# Patient Record
Sex: Female | Born: 2000 | Race: White | Hispanic: No | Marital: Single | Smoking: Never smoker
Health system: Southern US, Community
[De-identification: ages and names within clinical notes are randomized; demographics above are authoritative.]

## PROBLEM LIST (undated history)

## (undated) DIAGNOSIS — G43909 Migraine, unspecified, not intractable, without status migrainosus: Secondary | ICD-10-CM

## (undated) DIAGNOSIS — I456 Pre-excitation syndrome: Secondary | ICD-10-CM

## (undated) DIAGNOSIS — J45909 Unspecified asthma, uncomplicated: Secondary | ICD-10-CM

## (undated) DIAGNOSIS — F419 Anxiety disorder, unspecified: Secondary | ICD-10-CM

## (undated) HISTORY — DX: Pre-excitation syndrome: I45.6

## (undated) HISTORY — DX: Anxiety disorder, unspecified: F41.9

## (undated) HISTORY — DX: Migraine, unspecified, not intractable, without status migrainosus: G43.909

## (undated) HISTORY — DX: Unspecified asthma, uncomplicated: J45.909

---
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
## 2018-02-23 ENCOUNTER — Other Ambulatory Visit (INDEPENDENT_AMBULATORY_CARE_PROVIDER_SITE_OTHER): Payer: Self-pay | Admitting: Pediatrics

## 2018-02-23 DIAGNOSIS — G43009 Migraine without aura, not intractable, without status migrainosus: Secondary | ICD-10-CM

## 2018-05-06 DIAGNOSIS — J029 Acute pharyngitis, unspecified: Secondary | ICD-10-CM | POA: Diagnosis not present

## 2018-05-06 DIAGNOSIS — J309 Allergic rhinitis, unspecified: Secondary | ICD-10-CM | POA: Diagnosis not present

## 2018-09-14 DIAGNOSIS — B373 Candidiasis of vulva and vagina: Secondary | ICD-10-CM | POA: Diagnosis not present

## 2018-09-14 DIAGNOSIS — N898 Other specified noninflammatory disorders of vagina: Secondary | ICD-10-CM | POA: Diagnosis not present

## 2018-09-19 DIAGNOSIS — N760 Acute vaginitis: Secondary | ICD-10-CM | POA: Diagnosis not present

## 2018-09-19 DIAGNOSIS — Z30011 Encounter for initial prescription of contraceptive pills: Secondary | ICD-10-CM | POA: Diagnosis not present

## 2018-09-19 DIAGNOSIS — N765 Ulceration of vagina: Secondary | ICD-10-CM | POA: Diagnosis not present

## 2018-09-26 DIAGNOSIS — A6000 Herpesviral infection of urogenital system, unspecified: Secondary | ICD-10-CM | POA: Diagnosis not present

## 2018-09-26 DIAGNOSIS — R450 Nervousness: Secondary | ICD-10-CM | POA: Diagnosis not present

## 2018-11-21 ENCOUNTER — Other Ambulatory Visit (INDEPENDENT_AMBULATORY_CARE_PROVIDER_SITE_OTHER): Payer: Self-pay | Admitting: Pediatrics

## 2018-11-21 DIAGNOSIS — G43009 Migraine without aura, not intractable, without status migrainosus: Secondary | ICD-10-CM

## 2018-12-18 ENCOUNTER — Telehealth (INDEPENDENT_AMBULATORY_CARE_PROVIDER_SITE_OTHER): Payer: Self-pay | Admitting: Radiology

## 2018-12-18 DIAGNOSIS — G43009 Migraine without aura, not intractable, without status migrainosus: Secondary | ICD-10-CM

## 2018-12-18 MED ORDER — TOPIRAMATE 25 MG PO TABS
ORAL_TABLET | ORAL | 1 refills | Status: DC
Start: 2018-12-18 — End: 2019-01-07

## 2018-12-18 NOTE — Telephone Encounter (Signed)
Refill has been sent to the pharmacy.  

## 2018-12-18 NOTE — Telephone Encounter (Signed)
  Who's calling (name and relationship to patient) : Kellie Fowler  - father   Best contact number: [phone]  Provider they see: Dr Gaynell Face   Reason for call:  Dad called advising that [name] is almost out of Topiramate and will need a refill soon. Her last dose is by Sunday    PRESCRIPTION REFILL ONLY  Name of prescription: Topiramate 25 MG Tablet  Pharmacy:  [facility]  [address]  [place], [place] [address]

## 2019-01-07 ENCOUNTER — Other Ambulatory Visit: Payer: Self-pay

## 2019-01-07 ENCOUNTER — Ambulatory Visit (INDEPENDENT_AMBULATORY_CARE_PROVIDER_SITE_OTHER): Payer: BC Managed Care – PPO | Admitting: Pediatrics

## 2019-01-07 ENCOUNTER — Encounter (INDEPENDENT_AMBULATORY_CARE_PROVIDER_SITE_OTHER): Payer: Self-pay | Admitting: Pediatrics

## 2019-01-07 VITALS — BP 102/80 | HR 68 | Ht 61.75 in | Wt 168.4 lb

## 2019-01-07 DIAGNOSIS — G44219 Episodic tension-type headache, not intractable: Secondary | ICD-10-CM

## 2019-01-07 DIAGNOSIS — G43009 Migraine without aura, not intractable, without status migrainosus: Secondary | ICD-10-CM

## 2019-01-07 MED ORDER — TOPIRAMATE 25 MG PO TABS: ORAL_TABLET | ORAL | 5 refills | Status: DC

## 2019-01-07 NOTE — Patient Instructions (Signed)
It was good to see you again.  I am glad that you are not experiencing migraines.  I would like to see you in 6 months time.  I do not want to change your topiramate until you are out of school.  Please let me know if there is any change over the next few months.  I hope that you will take the opportunity this school break to become more physically active in the she will continue that when you return to bone.

## 2019-01-07 NOTE — Progress Notes (Signed)
Patient: Kellie Fowler MRN: [id number] Sex: female DOB: 2000/05/27  Provider: Wyline Copas, MD Location of Care: [facility]  Note type: Routine return visit  History of Present Illness: Referral Source: Judithann Sauger, MD History from: patient and [facility] chart Chief Complaint: Headaches  AEMILIA Fowler is a 18 y.o. female who was evaluated January 07, 2019 for the first time since March 26, 2017.  Kerah has migraine without aura and tension type headaches.  Migraines have been completely controlled with topiramate.  She has occasional tension type headaches once or twice a week that respond either to rest, hydration, or over-the-counter medication.  She has just completed her first semester of her sophomore year at [employer].  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  She has a couple of virtual tests to take at home and will not return to school until mid-January.  She is going to work at [employer] with her brother.  She lives in a dorm on campus and has 1 roommate in a suite that was made for 4 students two of the students left school and did not return.  In general her health is good but unfortunately she has gained 18 pounds in 20 months.  I challenged her to increase her physical activity during this holiday time when she has more time.  She has always enjoyed being physically active as she gets back into that it may carryover when she returns to school.  I recommended that she walk hike to around a soccer ball or go jogging.  I told her not to overdo it.  She takes fluoxetine for anxiety and depression this is prescribed by another physician.  She also takes Loestrin which has controlled dysmenorrhea.  Review of Systems: A complete review of systems was remarkable for patient is here to be seen for headaches. She states that she has one to two headaches a week. She states that the headaches contribute to being on virtual learning. She reports no  symptoms with the headaches. No other concerns at this time., all other systems reviewed and negative.  Past Medical History Diagnosis Date  . Anxiety   . Asthma   . Migraine   . Wolf-Parkinson-White syndrome    Hospitalizations: No., Head Injury: No., Nervous System Infections: No., Immunizations up to date: Yes.    Birth History 7lbs. 0oz. infant born at [redacted]weeks gestational age to a 20year old g 1p 52female. Gestation wasuncomplicated normal spontaneous vaginal delivery Nursery Course wasuncomplicated Growth and Development wasrecalled asnormal  Behavior History Anxiety  Surgical History History reviewed. No pertinent surgical history.  Family History family history includes Anxiety disorder in her father, mother, and paternal grandmother; Bipolar disorder in her paternal grandfather; Cancer in her paternal grandmother; Delayed puberty in her father; Depression in her father, mother, and paternal grandmother; Migraines in her mother; Seizures in her mother. Family history is negative for intellectual disabilities, blindness, deafness, birth defects, chromosomal disorder, or autism.  Social History Socioeconomic History  . Marital status: Single  . Years of education:  49  . Highest education level:  Sophomore in college  Occupational History  .  Working part-time at [employer]  . Financial resource strain: Not on file  . Food insecurity    Worry: Not on file    Inability: Not on file  . Transportation needs    Medical: Not on file    Non-medical: Not on file  Tobacco Use  . Smoking status: Never Smoker  .  Smokeless tobacco: Never Used  Substance and Sexual Activity  . Alcohol use: Not on file  . Drug use: Not on file  . 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    She attended [employer].    She lives with both parents. She has a younger brother.    She enjoys Soccer,  swimming, and playing with her dogs.    She is a Medical laboratory scientific officer at [employer]   No Known Allergies  Physical Exam BP 102/80   Pulse 68   Ht 5' 1.75" (1.568 m)   Wt 168 lb 6.4 oz (76.4 kg)   BMI 31.05 kg/m   General: alert, well developed, obese, in no acute distress, sandy hair, blue eyes, right handed Head: normocephalic, no dysmorphic features Ears, Nose and Throat: Otoscopic: tympanic membranes normal; pharynx: oropharynx is pink without exudates or tonsillar hypertrophy Neck: supple, full range of motion, no cranial or cervical bruits Respiratory: auscultation clear Cardiovascular: no murmurs, pulses are normal Musculoskeletal: no skeletal deformities or apparent scoliosis Skin: no rashes or neurocutaneous lesions  Neurologic Exam  Mental Status: alert; oriented to person, place and year; knowledge is normal for age; language is normal Cranial Nerves: visual fields are full to double simultaneous stimuli; extraocular movements are full and conjugate; pupils are round reactive to light; funduscopic examination shows sharp disc margins with normal vessels; symmetric facial strength; midline tongue and uvula; air conduction is greater than bone conduction bilaterally Motor: Normal strength, tone and mass; good fine motor movements; no pronator drift Sensory: intact responses to cold, vibration, proprioception and stereognosis Coordination: good finger-to-nose, rapid repetitive alternating movements and finger apposition Gait and Station: normal gait and station: patient is able to walk on heels, toes and tandem without difficulty; balance is adequate; Romberg exam is negative; Gower response is negative Reflexes: symmetric and diminished bilaterally; no clonus; bilateral flexor plantar responses  Assessment 1.  Migraine without aura without status migrainosus, not intractable, G43.009. 2.  Episodic tension type headache, G 44.219.  Discussion I am pleased that [name] is not  experiencing migraines.  I encouraged her to continue to take topiramate for at least the rest of the semester.  I would like to see her in 6 months when school is out and determine whether or not we continue her on topiramate.  Plan I talked about increasing her physical activity and working on portion control.  She has been under enormous stress with her mother's chronic illness and so I mentioned this but did not dwell on it.  I refilled her prescription for topiramate.  Greater than 50% of a 20-minute visit was spent in counseling and coordination of care concerning her headaches, her school performance, her weight, and setting a goal for her to maintain her weight over the next 6 months.   Medication List   Accurate as of January 07, 2019 11:59 PM. If you have any questions, ask your nurse or doctor.    FLUoxetine HCl 60 MG Tabs Take 1 tablet by mouth daily. What changed: Another medication with the same name was removed. Continue taking this medication, and follow the directions you see here. Changed by: Ellison Carwin, MD   Lo Loestrin Fe 1 MG-10 MCG / 10 MCG tablet Generic drug: Norethindrone-Ethinyl Estradiol-Fe Biphas Take 1 tablet See admin instructions by mouth.   topiramate 25 MG tablet Commonly known as: TOPAMAX TAKE  2 TABLETS AT NIGHTTIME    The  medication list was reviewed and reconciled. All changes or newly prescribed medications were explained.  A complete medication list was provided to the patient/caregiver.  Jodi Geralds MD

## 2019-01-26 DIAGNOSIS — Z1331 Encounter for screening for depression: Secondary | ICD-10-CM | POA: Diagnosis not present

## 2019-01-26 DIAGNOSIS — Z6830 Body mass index (BMI) 30.0-30.9, adult: Secondary | ICD-10-CM | POA: Diagnosis not present

## 2019-01-26 DIAGNOSIS — F419 Anxiety disorder, unspecified: Secondary | ICD-10-CM | POA: Diagnosis not present

## 2019-01-26 DIAGNOSIS — G43909 Migraine, unspecified, not intractable, without status migrainosus: Secondary | ICD-10-CM | POA: Diagnosis not present

## 2019-01-26 DIAGNOSIS — Z0001 Encounter for general adult medical examination with abnormal findings: Secondary | ICD-10-CM | POA: Diagnosis not present

## 2019-01-26 DIAGNOSIS — Z23 Encounter for immunization: Secondary | ICD-10-CM | POA: Diagnosis not present

## 2019-01-26 DIAGNOSIS — Z713 Dietary counseling and surveillance: Secondary | ICD-10-CM | POA: Diagnosis not present

## 2019-01-26 DIAGNOSIS — Z0000 Encounter for general adult medical examination without abnormal findings: Secondary | ICD-10-CM | POA: Diagnosis not present

## 2019-01-26 DIAGNOSIS — Z7251 High risk heterosexual behavior: Secondary | ICD-10-CM | POA: Diagnosis not present

## 2019-03-01 DIAGNOSIS — Z20828 Contact with and (suspected) exposure to other viral communicable diseases: Secondary | ICD-10-CM | POA: Diagnosis not present

## 2019-03-20 ENCOUNTER — Other Ambulatory Visit (INDEPENDENT_AMBULATORY_CARE_PROVIDER_SITE_OTHER): Payer: Self-pay | Admitting: Pediatrics

## 2019-03-20 DIAGNOSIS — G43009 Migraine without aura, not intractable, without status migrainosus: Secondary | ICD-10-CM

## 2019-06-26 DIAGNOSIS — Z20828 Contact with and (suspected) exposure to other viral communicable diseases: Secondary | ICD-10-CM | POA: Diagnosis not present

## 2019-07-08 ENCOUNTER — Encounter (INDEPENDENT_AMBULATORY_CARE_PROVIDER_SITE_OTHER): Payer: Self-pay | Admitting: Pediatrics

## 2019-07-08 ENCOUNTER — Ambulatory Visit (INDEPENDENT_AMBULATORY_CARE_PROVIDER_SITE_OTHER): Payer: BC Managed Care – PPO | Admitting: Pediatrics

## 2019-07-08 ENCOUNTER — Other Ambulatory Visit: Payer: Self-pay

## 2019-07-08 VITALS — BP 100/66 | HR 80 | Ht 61.75 in | Wt 163.4 lb

## 2019-07-08 DIAGNOSIS — G44219 Episodic tension-type headache, not intractable: Secondary | ICD-10-CM

## 2019-07-08 DIAGNOSIS — G43009 Migraine without aura, not intractable, without status migrainosus: Secondary | ICD-10-CM

## 2019-07-08 MED ORDER — TOPIRAMATE 25 MG PO TABS: ORAL_TABLET | ORAL | 5 refills | Status: DC

## 2019-07-08 NOTE — Progress Notes (Deleted)
Patient: Kellie Fowler MRN: [id number] Sex: female DOB: 24-May-2000  Provider: Ellison Carwin, MD Location of Care: [facility]  Note type: Routine return visit  History of Present Illness: Referral Source: Ronney Asters, MD History from: patient and [facility] chart Chief Complaint: Headaches  Kellie Fowler is a 19 y.o. female who ***  Review of Systems: A complete review of systems was remarkable for patient is here to be seen for headaches. She reports that she has one to two headaches every other week. She states that she has one migraine a month. She reports no symptoms. She has no other concerns at this time., all other systems reviewed and negative.  Past Medical History Past Medical History:  Diagnosis Date  . Anxiety   . Asthma   . Migraine   . Wolf-Parkinson-White syndrome    Hospitalizations: No., Head Injury: No., Nervous System Infections: No., Immunizations up to date: Yes.    ***  Birth History *** lbs. *** oz. infant born at *** weeks gestational age to a *** year old g *** p *** *** *** *** female. Gestation was {Complicated/Uncomplicated Pregnancy:20185} Mother received {CN Delivery analgesics:210120005}  {method of delivery:313099} Nursery Course was {Complicated/Uncomplicated:20316} Growth and Development was {cn recall:210120004}  Behavior History {Symptoms; behavioral problems:18883}  Surgical History History reviewed. No pertinent surgical history.  Family History family history includes Anxiety disorder in her father, mother, and paternal grandmother; Bipolar disorder in her paternal grandfather; Cancer in her paternal grandmother; Delayed puberty in her father; Depression in her father, mother, and paternal grandmother; Migraines in her mother; Seizures in her mother. Family history is negative for migraines, seizures, intellectual disabilities, blindness, deafness, birth defects, chromosomal disorder, or autism.  Social  History Social History   Socioeconomic History  . Marital status: Single    Spouse name: Not on file  . Number of children: Not on file  . Years of education: Not on file  . Highest education level: Not on file  Occupational History  . Not on file  Tobacco Use  . Smoking status: Never Smoker  . Smokeless tobacco: Never Used  Substance and Sexual Activity  . Alcohol use: Not on file  . Drug use: Not on file  . Sexual activity: Not on file  Other Topics Concern  . 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   She attended [employer].   She lives with both parents. She has a younger brother.   She enjoys Soccer, swimming, and playing with her dogs.   She is a Medical laboratory scientific officer at [employer] Strain:   . Difficulty of Paying Living Expenses:   Food Insecurity:   . Worried About Programme researcher, broadcasting/film/video in the Last Year:   . Barista in the Last Year:   Transportation Needs:   . Freight forwarder (Medical):   Marland Kitchen Lack of Transportation (Non-Medical):   Physical Activity:   . Days of Exercise per Week:   . Minutes of Exercise per Session:   Stress:   . Feeling of Stress :   Social Connections:   . Frequency of Communication with Friends and Family:   . Frequency of Social Gatherings with Friends and Family:   . Attends Religious Services:   . Active Member of Clubs or Organizations:   . Attends Banker Meetings:   Marland Kitchen Marital Status:  Allergies No Known Allergies  Physical Exam BP 100/66   Pulse 80   Ht 5' 1.75" (1.568 m)   Wt 163 lb 6.4 oz (74.1 kg)   BMI 30.13 kg/m   ***   Assessment   Discussion   Plan  Allergies as of 07/08/2019   No Known Allergies     Medication List       Accurate as of July 08, 2019  3:05 PM. If you have any questions, ask your nurse or doctor.        FLUoxetine HCl 60 MG Tabs Take 1 tablet by mouth  daily.   Lo Loestrin Fe 1 MG-10 MCG / 10 MCG tablet Generic drug: Norethindrone-Ethinyl Estradiol-Fe Biphas Take 1 tablet See admin instructions by mouth.   topiramate 25 MG tablet Commonly known as: TOPAMAX TAKE 2 TABLETS ORALLY AT NIGHTTIME       The medication list was reviewed and reconciled. All changes or newly prescribed medications were explained.  A complete medication list was provided to the patient/caregiver.  Jodi Geralds MD

## 2019-07-08 NOTE — Patient Instructions (Addendum)
It was great to see you today.  I am glad that you are doing well with your headaches and anxiety.  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  This summer sounds like fun.  Please let me know if there is anything that I can do for you using My Chart.

## 2019-07-08 NOTE — Progress Notes (Signed)
Patient: Kellie Fowler MRN: [id number] Sex: female DOB: 16-Sep-2000  Provider: Wyline Copas, MD Location of Care: [facility]  Note type: Routine return visit  History of Present Illness: Referral Source: Judithann Sauger, MD History from: patient and [facility] chart  Chief Complaint: Migraines   Kellie Fowler is a 19 y.o. female who has a history of migraine without aura and tension type headaches, last evaluated on January 07, 2019.   Kellie Fowler states that everything has been going very well. She states that she is only having 1-2 migraines every month. She is currently taking Tompamax 50mg  at night and denies any common side effects. Kellie Fowler will take 200-400mg  ibuprofen for abortive therapy and this works very well. She feels that the headaches are from staring at the computer during online schooling and do not correlate with menstruation.   Kellie Fowler also has a history of anxiety, for which she is currently taking fluoxetine and feels that her anxiety is very well managed. She just finished her sophomore year of college, all of which was online. She will return back to in-person schooling on August 10th, 2021 for her junior year in [employer]. Her brother will be starting at [employer] in the fall. Kellie Fowler states that she is sleeping well, maintaining adequate hydration, and has no questions or concerns today.   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  She has been immunized for coronavirus.  Review of Systems: A complete review of systems was assessed and was negative.  Past Medical History Diagnosis Date  . Anxiety   . Asthma   . Migraine   . Wolf-Parkinson-White syndrome    Hospitalizations: No., Head Injury: No., Nervous System Infections: No., Immunizations up to date: Yes.    Birth History 7 lbs. 0 oz. infant born at [redacted] weeks gestational age to a 21 year old g 1 p 0 female. Gestation was uncomplicated normal spontaneous  vaginal delivery Nursery Course was uncomplicated Growth and Development was recalled as  normal  Behavior History Anxiety  Surgical History History reviewed. No pertinent surgical history.  Family History family history includes Anxiety disorder in her father, mother, and paternal grandmother; Bipolar disorder in her paternal grandfather; Cancer in her paternal grandmother; Delayed puberty in her father; Depression in her father, mother, and paternal grandmother; Migraines in her mother; Seizures in her mother. Family history is negative for intellectual disabilities, blindness, deafness, birth defects, chromosomal disorder, or autism.  Social History Socioeconomic History  . Marital status: Single  . Years of education:  22  . Highest education level:  Rising junior in college  Occupational History  .  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 Smoking status: Never Smoker  . Smokeless tobacco: Never Used  Substance and Sexual Activity  . Alcohol use: Not on file  . Drug use: Not on file  . 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    She attended [employer].    She lives with both parents. She has a younger brother.    She enjoys Soccer, swimming, and playing with her dogs.    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   No Known Allergies  Physical Exam BP 100/66   Pulse 80   Ht 5' 1.75" (1.568 m)   Wt 163 lb 6.4 oz (74.1 kg)  BMI 30.13 kg/m   General: alert, well developed, obese, in no acute distress, brown hair, blue eyes, right handed Head: normocephalic, no dysmorphic features Ears, Nose and Throat: Otoscopic: tympanic membranes normal; pharynx: oropharynx is pink without exudates or tonsillar hypertrophy Neck: supple, full range of motion, no cranial or cervical bruits Respiratory: auscultation clear Cardiovascular: no murmurs,  pulses are normal Musculoskeletal: no skeletal deformities or apparent scoliosis Skin: no rashes or neurocutaneous lesions  Neurologic Exam  Mental Status: alert; oriented to person, place and year; knowledge is normal for age; language is normal Cranial Nerves: visual fields are full to double simultaneous stimuli; extraocular movements are full and conjugate; pupils are round reactive to light; funduscopic examination shows sharp disc margins with normal vessels; symmetric facial strength; midline tongue and uvula; air conduction is greater than bone conduction bilaterally Motor: Normal strength, tone and mass; good fine motor movements; no pronator drift Sensory: intact responses to cold, vibration, proprioception and stereognosis Coordination: good finger-to-nose, rapid repetitive alternating movements and finger apposition Gait and Station: normal gait and station: patient is able to walk on heels, toes and tandem without difficulty; balance is adequate; Kellie Fowler exam is negative; Gower response is negative Reflexes: symmetric and diminished bilaterally; no clonus; bilateral flexor plantar responses  Assessment 1.  Migraine without aura without status migrainosus, not intractable, G43.009. 2.  Episodic tension-type headache, not intractable, G44.219.  Discussion [name] is a 19 year old female with a past medical history of anxiety, migraine without aura and tension-type headaches, presenting for routine follow-up. Migraines are managed well on Topamax 50mg  nightly without any notable side effects. She is doing very well overall.   [name] is doing very well with only 1-2 migraines per month that resolve with 200-400mg  ibuprofen. She does not feel that she needs any different abortive medications at this time, though we will consider Triptans should the migraines become more severe or more frequent.   Plan Follow-up in 6 months Continue Topamax 50mg  nightly, prescription was written    Medication List   Accurate as of July 08, 2019 11:59 PM. If you have any questions, ask your nurse or doctor.    FLUoxetine HCl 60 MG Tabs Take 1 tablet by mouth daily.   Lo Loestrin Fe 1 MG-10 MCG / 10 MCG tablet Generic drug: Norethindrone-Ethinyl Estradiol-Fe Biphas Take 1 tablet See admin instructions by mouth.   topiramate 25 MG tablet Commonly known as: TOPAMAX TAKE 2 TABLETS ORALLY AT NIGHTTIME    The medication list was reviewed and reconciled. All changes or newly prescribed medications were explained.  A complete medication list was provided to the patient/caregiver.  , DO  [facility], PGY-1  Greater than 50% of a 25-minute visit was spent in counseling and coordination of care concerning her headaches anxiety, school performance, and discussing her plans for the future.  I supervised Dr. July 10, 2019 and agree with her assessment and documentation except as amended.  I performed physical examination, participated in history taking, and guided decision making.  Christophe Louis MD

## 2019-09-03 DIAGNOSIS — Z20828 Contact with and (suspected) exposure to other viral communicable diseases: Secondary | ICD-10-CM | POA: Diagnosis not present

## 2019-09-07 DIAGNOSIS — Z20828 Contact with and (suspected) exposure to other viral communicable diseases: Secondary | ICD-10-CM | POA: Diagnosis not present

## 2019-09-16 DIAGNOSIS — F419 Anxiety disorder, unspecified: Secondary | ICD-10-CM | POA: Diagnosis not present

## 2019-10-09 DIAGNOSIS — R1032 Left lower quadrant pain: Secondary | ICD-10-CM | POA: Diagnosis not present

## 2019-10-09 DIAGNOSIS — S301XXA Contusion of abdominal wall, initial encounter: Secondary | ICD-10-CM | POA: Diagnosis not present

## 2019-10-09 DIAGNOSIS — Y9363 Activity, rugby: Secondary | ICD-10-CM | POA: Diagnosis not present

## 2019-10-09 DIAGNOSIS — W500XXA Accidental hit or strike by another person, initial encounter: Secondary | ICD-10-CM | POA: Diagnosis not present

## 2019-10-09 DIAGNOSIS — M7989 Other specified soft tissue disorders: Secondary | ICD-10-CM | POA: Diagnosis not present

## 2019-10-17 DIAGNOSIS — Z20822 Contact with and (suspected) exposure to covid-19: Secondary | ICD-10-CM | POA: Diagnosis not present

## 2020-02-01 ENCOUNTER — Ambulatory Visit (INDEPENDENT_AMBULATORY_CARE_PROVIDER_SITE_OTHER): Payer: Self-pay | Admitting: Pediatrics

## 2020-02-03 ENCOUNTER — Encounter (INDEPENDENT_AMBULATORY_CARE_PROVIDER_SITE_OTHER): Payer: Self-pay | Admitting: Pediatrics

## 2020-02-03 ENCOUNTER — Ambulatory Visit (INDEPENDENT_AMBULATORY_CARE_PROVIDER_SITE_OTHER): Payer: BC Managed Care – PPO | Admitting: Pediatrics

## 2020-02-03 ENCOUNTER — Other Ambulatory Visit: Payer: Self-pay

## 2020-02-03 VITALS — BP 100/62 | HR 80 | Ht 64.75 in | Wt 163.0 lb

## 2020-02-03 DIAGNOSIS — G44219 Episodic tension-type headache, not intractable: Secondary | ICD-10-CM

## 2020-02-03 DIAGNOSIS — G43009 Migraine without aura, not intractable, without status migrainosus: Secondary | ICD-10-CM | POA: Diagnosis not present

## 2020-02-03 DIAGNOSIS — G43109 Migraine with aura, not intractable, without status migrainosus: Secondary | ICD-10-CM | POA: Diagnosis not present

## 2020-02-03 MED ORDER — TOPIRAMATE 25 MG PO TABS: ORAL_TABLET | ORAL | 5 refills | Status: DC

## 2020-02-03 NOTE — Patient Instructions (Signed)
It was a pleasure to see you today.  I am glad that your migraines are not frequent.  We talked about triptan medicines and CGRP inhibitors that can be useful to abort your headaches as long as over-the-counter medications plus a nap takes care of them there may be no reason to do that.  I want to see you again in 6 months.  At that time we will provide contact information for your new provider.  My intent is to talk with [place] the women who are neurologist in our community and find one who would be suited to care for your migraines.  This is important because as you get older topiramate can cause cleft lip cleft palate we may have to make changes.  Please let me know if I can help you before I see you again.

## 2020-02-03 NOTE — Progress Notes (Signed)
Patient: Kellie Fowler MRN: [id number] Sex: female DOB: 11/24/2000  Provider: Ellison Carwin, MD Location of Care: [facility]  Note type: Routine return visit  History of Present Illness: Referral Source: Ronney Asters, MD History from: patient and [facility] chart Chief Complaint: Migraines  Kellie Fowler is a 19 y.o. female who was evaluated February 03, 2020 for the first time since July 08, 2019.  [name] has migraine without aura and with aura and episodic tension type headaches.  She also has a history of anxiety which is currently well managed.  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  Her brother joined her this year on campus.  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  She is fully immunized including booster.  She has up to 2 migraines every month.  She takes Topamax 50 mg at nighttime without side effects.  Over-the-counter medication 40 mg of ibuprofen will bring her migraines under control with a brief nap.  At present she does not feel the need to have a prescription medication as an abortive treatment.  I explained to her that there were 2 classes both of which were very effective.  Her general health is good.  She goes to bed typically around midnight to 2 AM and will start classes at 9 AM this term.  I strongly advised her to get 8 hours of rest as much as she can.  We discussed whether or not to increase the topiramate I do not think there is a reason to do so.  Review of Systems: A complete review of systems was assessed and was negative.  Past Medical History Diagnosis Date  . Anxiety   . Asthma   . Migraine   . Wolf-Parkinson-White syndrome    Hospitalizations: No., Head Injury: No., Nervous System Infections: No., Immunizations up to date: Yes.    Birth History 7 lbs. 0 oz. infant born at [redacted] weeks gestational age to a 21 year old g 1 p 0 female. Gestation was  uncomplicated normal spontaneous vaginal delivery Nursery Course was uncomplicated Growth and Development was recalled as  normal  Behavior History Anxiety  Surgical History History reviewed. No pertinent surgical history.  Family History family history includes Anxiety disorder in her father, mother, and paternal grandmother; Bipolar disorder in her paternal grandfather; Cancer in her paternal grandmother; Delayed puberty in her father; Depression in her father, mother, and paternal grandmother; Migraines in her mother; Seizures in her mother. Family history is negative for migraines, intellectual disabilities, blindness, deafness, birth defects, chromosomal disorder, or autism.  Social History Socioeconomic History  . Marital status: Single  . Years of education:  83  . Highest education level:  Primary school teacher  Occupational History  . Not currently employed  Tobacco Use  . Smoking status: Never Smoker  . Smokeless tobacco: Never Used  Substance and Sexual Activity  . Alcohol use: Not on file  . Drug use: Not on file  . 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    She attended [employer].    She lives with both parents. She has a younger brother.    She enjoys Soccer, swimming, and playing with her dogs.    She is a Holiday representative at [employer]   No Known Allergies  Physical Exam BP 100/62   Pulse 80   Ht 5' 4.75" (1.645 m)  Wt 163 lb (73.9 kg)   BMI 27.33 kg/m   General: alert, well developed, obese, in no acute distress, brown hair, blue eyes, right handed Head: normocephalic, no dysmorphic features Ears, Nose and Throat: Otoscopic: tympanic membranes normal; pharynx: oropharynx is pink without exudates or tonsillar hypertrophy Neck: supple, full range of motion, no cranial or cervical bruits Respiratory: auscultation clear Cardiovascular: no murmurs, pulses are normal Musculoskeletal: no  skeletal deformities or apparent scoliosis Skin: no rashes or neurocutaneous lesions  Neurologic Exam  Mental Status: alert; oriented to person, place and year; knowledge is normal for age; language is normal Cranial Nerves: visual fields are full to double simultaneous stimuli; extraocular movements are full and conjugate; pupils are round reactive to light; funduscopic examination shows sharp disc margins with normal vessels; symmetric facial strength; midline tongue and uvula; air conduction is greater than bone conduction bilaterally Motor: Normal strength, tone and mass; good fine motor movements; no pronator drift Sensory: intact responses to cold, vibration, proprioception and stereognosis Coordination: good finger-to-nose, rapid repetitive alternating movements and finger apposition Gait and Station: normal gait and station: patient is able to walk on heels, toes and tandem without difficulty; balance is adequate; Romberg exam is negative; Gower response is negative Reflexes: symmetric and diminished bilaterally; no clonus; bilateral flexor plantar responses  Assessment 1.  Migraine without aura, without status migrainosus, not intractable, G43.009. 2.  Migraine with aura without status migrainosus, not intractable, G43.109. 3.  Episodic tension type headache, G44.219.  Discussion I am pleased that she is doing well.  There is no reason to change her current treatments.  We discussed preventative medications with triptans and CGRP inhibitors.  At present we are going to hold off.  Plan A prescription was written for topiramate and sent to a drugstore in [place].  Greater than 50% of a 20-minute visit was spent in counseling and coordination of care concerning her headaches and their treatment.  I also talked about long-term transition of care and would see a female adult neurologist for her in [place].   Medication List   Accurate as of February 03, 2020 11:37 AM. If you have any  questions, ask your nurse or doctor.    FLUoxetine HCl 60 MG Tabs Take 1 tablet by mouth daily.   Lo Loestrin Fe 1 MG-10 MCG / 10 MCG tablet Generic drug: Norethindrone-Ethinyl Estradiol-Fe Biphas Take 1 tablet See admin instructions by mouth.   topiramate 25 MG tablet Commonly known as: TOPAMAX TAKE 2 TABLETS ORALLY AT NIGHTTIME    The medication list was reviewed and reconciled. All changes or newly prescribed medications were explained.  A complete medication list was provided to the patient/caregiver.  Deetta Perla MD

## 2020-06-09 ENCOUNTER — Encounter (INDEPENDENT_AMBULATORY_CARE_PROVIDER_SITE_OTHER): Payer: Self-pay

## 2020-07-07 ENCOUNTER — Ambulatory Visit (INDEPENDENT_AMBULATORY_CARE_PROVIDER_SITE_OTHER): Payer: BC Managed Care – PPO | Admitting: Pediatrics

## 2020-07-11 ENCOUNTER — Telehealth (INDEPENDENT_AMBULATORY_CARE_PROVIDER_SITE_OTHER): Payer: Self-pay | Admitting: Pediatrics

## 2020-07-11 DIAGNOSIS — G43009 Migraine without aura, not intractable, without status migrainosus: Secondary | ICD-10-CM

## 2020-07-11 DIAGNOSIS — G44219 Episodic tension-type headache, not intractable: Secondary | ICD-10-CM

## 2020-07-11 DIAGNOSIS — G43109 Migraine with aura, not intractable, without status migrainosus: Secondary | ICD-10-CM

## 2020-07-11 NOTE — Addendum Note (Signed)
Addended by: Deetta Perla on: 07/11/2020 03:55 PM   Modules accepted: Orders

## 2020-07-11 NOTE — Telephone Encounter (Signed)
Patient missed an appointment.  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

## 2020-08-27 ENCOUNTER — Other Ambulatory Visit (INDEPENDENT_AMBULATORY_CARE_PROVIDER_SITE_OTHER): Payer: Self-pay | Admitting: Pediatrics

## 2020-08-27 DIAGNOSIS — G43009 Migraine without aura, not intractable, without status migrainosus: Secondary | ICD-10-CM

## 2020-09-27 ENCOUNTER — Other Ambulatory Visit (INDEPENDENT_AMBULATORY_CARE_PROVIDER_SITE_OTHER): Payer: Self-pay | Admitting: Pediatrics

## 2020-09-27 DIAGNOSIS — G43009 Migraine without aura, not intractable, without status migrainosus: Secondary | ICD-10-CM

## 2020-10-05 ENCOUNTER — Other Ambulatory Visit (INDEPENDENT_AMBULATORY_CARE_PROVIDER_SITE_OTHER): Payer: Self-pay

## 2020-10-05 DIAGNOSIS — G43009 Migraine without aura, not intractable, without status migrainosus: Secondary | ICD-10-CM

## 2020-10-05 MED ORDER — TOPIRAMATE 25 MG PO TABS: ORAL_TABLET | ORAL | 5 refills | Status: AC

## 2020-10-05 MED ORDER — TOPIRAMATE 25 MG PO TABS: ORAL_TABLET | ORAL | 0 refills | Status: DC

## 2020-10-05 NOTE — Addendum Note (Signed)
Addended by: Deetta Perla on: 10/05/2020 08:15 PM   Modules accepted: Orders

## 2021-05-24 ENCOUNTER — Other Ambulatory Visit: Payer: Self-pay | Admitting: Family Medicine

## 2021-05-24 DIAGNOSIS — E010 Iodine-deficiency related diffuse (endemic) goiter: Secondary | ICD-10-CM

## 2022-10-21 DIAGNOSIS — R0981 Nasal congestion: Secondary | ICD-10-CM | POA: Diagnosis not present

## 2022-10-21 DIAGNOSIS — R051 Acute cough: Secondary | ICD-10-CM | POA: Diagnosis not present

## 2022-10-21 DIAGNOSIS — Z03818 Encounter for observation for suspected exposure to other biological agents ruled out: Secondary | ICD-10-CM | POA: Diagnosis not present

## 2022-12-05 DIAGNOSIS — R87612 Low grade squamous intraepithelial lesion on cytologic smear of cervix (LGSIL): Secondary | ICD-10-CM | POA: Diagnosis not present

## 2022-12-05 DIAGNOSIS — F411 Generalized anxiety disorder: Secondary | ICD-10-CM | POA: Diagnosis not present

## 2022-12-05 DIAGNOSIS — G43009 Migraine without aura, not intractable, without status migrainosus: Secondary | ICD-10-CM | POA: Diagnosis not present

## 2022-12-05 DIAGNOSIS — B009 Herpesviral infection, unspecified: Secondary | ICD-10-CM | POA: Diagnosis not present

## 2023-03-07 DIAGNOSIS — R0981 Nasal congestion: Secondary | ICD-10-CM | POA: Diagnosis not present

## 2023-03-07 DIAGNOSIS — R051 Acute cough: Secondary | ICD-10-CM | POA: Diagnosis not present

## 2023-03-07 DIAGNOSIS — U071 COVID-19: Secondary | ICD-10-CM | POA: Diagnosis not present

## 2023-12-19 DIAGNOSIS — J029 Acute pharyngitis, unspecified: Secondary | ICD-10-CM | POA: Diagnosis not present

## 2023-12-19 DIAGNOSIS — H1032 Unspecified acute conjunctivitis, left eye: Secondary | ICD-10-CM | POA: Diagnosis not present

## 2023-12-25 DIAGNOSIS — F411 Generalized anxiety disorder: Secondary | ICD-10-CM | POA: Diagnosis not present

## 2023-12-25 DIAGNOSIS — R87610 Atypical squamous cells of undetermined significance on cytologic smear of cervix (ASC-US): Secondary | ICD-10-CM | POA: Diagnosis not present

## 2023-12-25 DIAGNOSIS — B009 Herpesviral infection, unspecified: Secondary | ICD-10-CM | POA: Diagnosis not present

## 2023-12-25 DIAGNOSIS — R875 Abnormal microbiological findings in specimens from female genital organs: Secondary | ICD-10-CM | POA: Diagnosis not present

## 2023-12-25 DIAGNOSIS — R87612 Low grade squamous intraepithelial lesion on cytologic smear of cervix (LGSIL): Secondary | ICD-10-CM | POA: Diagnosis not present

## 2023-12-25 DIAGNOSIS — Z0000 Encounter for general adult medical examination without abnormal findings: Secondary | ICD-10-CM | POA: Diagnosis not present
